# Patient Record
Sex: Female | Born: 1970 | Race: White | Hispanic: No | Smoking: Never smoker
Health system: Southern US, Community
[De-identification: ages and names within clinical notes are randomized; demographics above are authoritative.]

---

## 2002-06-22 ENCOUNTER — Ambulatory Visit (HOSPITAL_COMMUNITY): Admission: RE | Admit: 2002-06-22 | Discharge: 2002-06-22 | Payer: Self-pay | Admitting: *Deleted

## 2002-06-22 ENCOUNTER — Encounter: Payer: Self-pay | Admitting: *Deleted

## 2002-06-23 ENCOUNTER — Encounter: Payer: Self-pay | Admitting: *Deleted

## 2002-06-23 ENCOUNTER — Ambulatory Visit (HOSPITAL_COMMUNITY): Admission: RE | Admit: 2002-06-23 | Discharge: 2002-06-24 | Payer: Self-pay | Admitting: *Deleted

## 2002-09-10 ENCOUNTER — Encounter: Payer: Self-pay | Admitting: *Deleted

## 2002-09-10 ENCOUNTER — Ambulatory Visit (HOSPITAL_COMMUNITY): Admission: RE | Admit: 2002-09-10 | Discharge: 2002-09-10 | Payer: Self-pay | Admitting: *Deleted

## 2006-12-22 ENCOUNTER — Ambulatory Visit (HOSPITAL_COMMUNITY): Admission: RE | Admit: 2006-12-22 | Discharge: 2006-12-23 | Payer: Self-pay | Admitting: Neurosurgery

## 2007-05-04 ENCOUNTER — Ambulatory Visit: Payer: Self-pay | Admitting: Cardiology

## 2010-12-02 NOTE — Op Note (Signed)
Janice Ruiz, Janice Ruiz                ACCOUNT NO.:  [id number]   MEDICAL RECORD NO.:  [id number]          PATIENT TYPE:  AMB   LOCATION:  SDS                          FACILITY:  [facility]   PHYSICIAN:  Cristi Loron, M.D.DATE OF BIRTH:  Oct 18, 1970   DATE OF PROCEDURE:  12/22/2006  DATE OF DISCHARGE:                               OPERATIVE REPORT   BRIEF HISTORY:  The patient is 40 year old white female who has suffered  from back and left leg pain consistent with a left L4-T5 radiculopathy.  She failed medical management and was worked up with a lumbar MRI which  demonstrated herniated disk, L3-4 on the left.  I discussed various  treatment options with the patient including surgery.  The patient has  weighed the risks, benefits and alternatives to surgery and decided to  proceed with the left L3-4 microdiskectomy.   PREOPERATIVE DIAGNOSIS:  Left L3-4 herniated nucleus pulposus, spinal  stenosis, lumbar radiculopathy, lumbago.   POSTOPERATIVE DIAGNOSIS:  Left L3-4 herniated nucleus pulposus, spinal  stenosis, lumbar radiculopathy, lumbago.   PROCEDURE:  Left L3-4 microdiskectomy using microdissection.   SURGEON:  Dr. Delma Officer.   ASSISTANT:  None.   ANESTHESIA:  General endotracheal.   ESTIMATED BLOOD LOSS:  50 mL.   SPECIMENS:  None.   DRAINS:  None.   COMPLICATIONS:  None.   DESCRIPTION OF PROCEDURE:  The patient is brought to operating room by  anesthesia team.  General endotracheal anesthesia induced.  The patient  was turned prone position on Wilson frame.  Lumbosacral region was then  prepared with Betadine scrub and Betadine solution.  Sterile drapes  applied.  I then injected the area to be incised with Marcaine with  epinephrine solution.  I used scalpel to make a linear midline incision  over the L3-4 interspace.  I used electrocautery perform a left-sided  subperiosteal dissection exposing left spinous process lamina of L3 and  L4.  We obtained  intraoperative radiograph to confirm our location.   I then inserted a McCullough retractor for exposure and then brought the  operative microscope into the field.  Under its magnification and  illumination, completed the microdissection and decompression.  I used  high-speed drill perform a left L3 laminotomy.  I then widened the  laminotomies using Kerrison punch and removed left L3-4 ligamentum  flavum.  I also used the drill and Kerrison punch removing the cephalad  aspect of the left L4 lamina to perform a foraminotomy about the left L4  nerve root.  I then used microsection to free up the thecal sac from the  epidural tissue.  I then gently retracted the neural structures medially  with the D'Errico retractor.  This  exposed a disk herniation which had  migrated caudally from the L3-4 intervertebral disk space.  I removed  this disk fragment in multiple fragments using pituitary forceps.  I  then inspected intervertebral disk at L3-4.  It was bulging quite a bit  causing some ventral compression.  I therefore incised L3-4  intervertebral disk on the left and performed a partial intervertebral  diskectomy using pituitary  forceps and the Epstein curettes.  After I  was satisfied with intervertebral diskectomy, I used the osteophyte tool  to remove some redundant posterior longitudinal ligament from the  vertebral endplate at L3-4 further decompressing the neural structures.  We then obtained hemostasis using bipolar cautery.  We palpated along  the ventral surface of the thecal sac and along on the exit route of the  left L4 nerve root, noted neural structures well decompressed.  We  obtained hemostasis using bipolar electrocautery.  Then removed the  retractor and then reapproximated the patient's thoracolumbar fascia  with interrupted #1 Vicryl suture.  The subcutaneous tissue interrupted  2-0 Vicryl suture and the skin with Steri-Strips and Benzoin.  The wound  was then coated with  bacitracin ointment, sterile dressing applied.  The  drapes were removed.  The patient was subsequently returned to supine  position where she was extubated by anesthesia team and transported to  post anesthesia care unit in stable condition.  All sponge, instrument  and needle counts correct at end this case.      Cristi Loron, M.D.  Electronically Signed     JDJ/MEDQ  D:  12/22/2006  T:  12/22/2006  Job:  [id number]

## 2010-12-05 NOTE — Op Note (Signed)
NAMERAISA, DITTO                            ACCOUNT NO.:  [id number]   MEDICAL RECORD NO.:  [id number]                   PATIENT TYPE:  OIB   LOCATION:  3012                                 FACILITY:  [facility]   PHYSICIAN:  Ricky D. Gasper Sells, M.D.             DATE OF BIRTH:  Feb 15, 1971   DATE OF PROCEDURE:  06/23/2002  DATE OF DISCHARGE:                                 OPERATIVE REPORT   PREOPERATIVE DIAGNOSES:  1. Herniated nucleus pulposus center lying to the left, lumbar 4-5.  2. Herniated nucleus pulposus center lying to the right, lumbar 5, sacral 1.   POSTOPERATIVE DIAGNOSIS:   OPERATION PERFORMED:  1. Left lumbar 4-5 radical discoidectomy, microsurgical.  2. Right lumbar 5, sacral 1 radical discoidectomy, microsurgical.   SURGEON:  Ricky D. Gasper Sells, M.D.   ASSISTANT:  Alvis Lemmings, M.D.   ANESTHESIA:  General, controlled.   COUNTS:  Instrument count, sponge count and needle count correct.   COMPLICATIONS:  Nil.   ESTIMATED BLOOD LOSS:  Less than 50 cc.  No CSF leak.  No particular trauma  to nerve roots.   SUMMARY:  This is a 40 year old female with a four-year history of  intermittent low-back pain, which has been constant for the last month.  I  saw her in my office a few days before surgery.  She clearly had  dorsiflexion weakness on the left, and numbness and tingling at the top of  the foot of an intermittent nature, and plantar flexion weakness on the  right with positive straight leg raises and crossed straight leg raises  bilaterally.  Brought an MRI from mid November with her, which showed an HNP  eccentric to the left at L4-5 and near the midline, and smaller HNP more  midline at L5, S1.  I was going to do a myelogram on her before surgery, but  she continued to take her Prozac so that was stopped.  I felt her clinical  findings were strong enough plus the MRI that a week ago I had not operated  on her.  This was carried out on June 23, 2002.   At L4-5 on the left a huge free fragment was found subligamentously  measuring approximately 3.5 cm long by 5 mm by a centimeter wide.  This  extended from the left side over to the right.  For this reason it was not  felt necessary to decompress the right side at L4-5.  A L5, S1 a smaller  midline HNP was found and it was removed.  Both the left L5 and S1 nerve  roots were nicely decompressed.  The patient awoke from surgery moving all  four extremities, especially her dorsiflexion weakness was better as was the  plantar flexion weakness on the left and right respectively.   DESCRIPTION OF OPERATION:  With the patient in the prone position, all  pressure points inspected and padded  especially the common peroneal nerves  bilaterally and with bear hugger blanket in place and PDS hose, a midline  incision was made over the __________ L4-5 level and the spinous processes  were identified.  Starting out on the left paraspinous muscles and ligaments  were dissected free of the spinous processes and laminae of L4, L5 and S1.  Intraoperative control x-rays confirmed the appropriate levels were being  done, that is L4-5 and L5, S1.  Following that the right side was exposed  and self-retaining retractors were inserted.  Starting out on the left side  minimal inferior L4 laminotomy was performed as well as a medial facetectomy  at L4-5, and the underlying ligamentum flavum was removed with a 15 blade  knife reaching the epidural space and pushing it away with a patty.  Kerrison punch was then used to remove the ligamentum flavum in a stepwise  fashion; revealed the underlying ruptured L4-5 disk.  Bipolar was then used  to coagulate epidural veins and the L5 nerve was retracted medially to  reveal the underlying disk.  It was incised in a trapezoidal fashion by a 15  blade knife and immediately a large fragment started to express itself from  the interspace.  This fragment was grasped with  a pituitary instrument and  removed en bloc measuring approximately 3.5 cm x a centimeter x 5 mm.  Nevertheless, the disk was evacuated with straight-biting, upbiting and down  biting pituitary rongeurs, curetted with an Epstein instrument in the  subligamentous fascia.  Then the endplates were curetted especially the  inferior limbus with a medium bent ring curet.  The epidural space was then  palpated with the hockey stick instrument and epidural hemostasis was  achieved with Gelfoam.  Following that, once it was found to be clear a  hundred micrograms of fentanyl was left on the Gelfoam for three minutes and  then irrigated free.  The exact same procedure was carried out on the right  at L5, S1.  No free fragment was found, but a moderate midline ruptured disk  was found.  No problems were encountered at that level.   The wound was then irrigated with Betadine solution and hydrogen peroxide.  The self-retaining retractor was removed.  The dorsal lumbar fascia was  closed with 2-0 Vicryl in an interrupted fashion and the subfascial space  was inflated with a total of 5 cc of vancomycin solution from the  intravenous injection at the beginning of the case.  Subcutaneous closure  was carried pit with 2-0 Vicryl inverted interrupted and skin staples were  used in the skin.  A bacitracin ointment dry dressing was applied.   The patient tolerated the procedure well and awoke from surgery  unremarkably.                                                 Ricky D. Gasper Sells, M.D.    RDH/MEDQ  D:  06/23/2002  T:  06/24/2002  Job:  [id number]

## 2010-12-05 NOTE — Discharge Summary (Signed)
NAMEYAMNA, MACKEL                            ACCOUNT NO.:  [id number]   MEDICAL RECORD NO.:  [id number]                   PATIENT TYPE:  OIB   LOCATION:  3012                                 FACILITY:  [facility]   PHYSICIAN:  Ricky D. Gasper Sells, M.D.             DATE OF BIRTH:  1970/08/29   DATE OF ADMISSION:  06/23/2002  DATE OF DISCHARGE:  06/24/2002                                 DISCHARGE SUMMARY   ADMISSION DIAGNOSIS:  Herniated nucleus pulposis centerline eccentric to the  left, L4-5 and centerline perhaps to the right L5-S1 per MRI in 11/03, with  right S1 radiculopathy, mild, and left L5 radiculopathy, mixed motor and  sensory, moderate.   POSTOPERATIVE DIAGNOSIS:  Herniated nucleus pulposis centerline eccentric to  the left, L4-5 and centerline perhaps to the right L5-S1 per MRI in 11/03,  with right S1 radiculopathy, mild, and left L5 radiculopathy, mixed motor  and sensory, moderate.   PROCEDURE:  Left L4-5 microsurgical diskectomy and right L5-S1 microsurgical  diskectomy.   SURGEON:  Ricky D. Gasper Sells, M.D.   Threasa HeadsHope Pigeon.   ANESTHESIA:  General controlled.  Instrument count, sponge count, needle  count correct.   COMPLICATIONS:  Nil.   HISTORY OF PRESENT ILLNESS:  This is a 40 year old female that presented  with bilateral leg pain for the last month.  She has had similar episodes  for the last four years, each time lasting a month.  This time is more  severe, has not responded to medical treatment which is appropriate.  For  that reason, a MRI was done which showed a definite herniated nucleus  pulposis eccentric to the left midline at L4-5 and a smaller one at L5-S1  eccentric to the right.  On examination, she had history of a left L5  radiculopathy with numbness and tingling intermittent of the top of the foot  and definite dorsiflexion weakness, and some right-sided plantar flexion  weakness and an absent ankle jerk on the right with numbness in  the bottom  of the foot on the right side intermittent.  Based on these findings and the  resistance to conservative treatment and the repetitive episodes, it was  decided to go ahead and do a lumbar laminectomy on her ASAP.   HOSPITAL COURSE:  This was carried out on 06/23/02, it was completely  uncomplicated.  There was a huge fragment extending way over to the right  side past the midline, taking off from the left.  For that reason, I did not  do a bilateral exploration.  It was clear that we had gotten most of it from  the left side.  At L5-S1 the findings were less dramatic.  There was no huge  free fragment, but there was an eccentric bulging subligamentous disk.  The  operation itself was uncomplicated.  The patient tolerated the procedure  well.  In the evening postoperatively, I was  going to send her home, but she  was having trouble voiding as well as passing stool.  She began to void in  the morning following surgery after I put a Foley catheter in her overnight,  and gave her some Urecholine in the morning.  She was still constipated.  She was given a Fleet's enema, 20 mg of OxyContin and sent home.  Told to  change her dressing every second day.  She is given Lorcet 10 mg p.o. p.r.n.  for pain.  I will see her again in one weeks' time for followup in the  office.   CONDITION ON DISCHARGE:  Markedly improved.   DISPOSITION:  Home.   DISCHARGE DIAGNOSIS:  Status post L4-5 left and right L5-S1 lumbar  laminectomy.                                                Ricky D. Gasper Sells, M.D.    RDH/MEDQ  D:  06/24/2002  T:  06/25/2002  Job:  [id number]

## 2015-12-25 DIAGNOSIS — F410 Panic disorder [episodic paroxysmal anxiety] without agoraphobia: Secondary | ICD-10-CM | POA: Diagnosis not present

## 2015-12-25 DIAGNOSIS — K210 Gastro-esophageal reflux disease with esophagitis: Secondary | ICD-10-CM | POA: Diagnosis not present

## 2015-12-25 DIAGNOSIS — M1990 Unspecified osteoarthritis, unspecified site: Secondary | ICD-10-CM | POA: Diagnosis not present

## 2015-12-25 DIAGNOSIS — R5383 Other fatigue: Secondary | ICD-10-CM | POA: Diagnosis not present

## 2015-12-30 DIAGNOSIS — F331 Major depressive disorder, recurrent, moderate: Secondary | ICD-10-CM | POA: Diagnosis not present

## 2015-12-30 DIAGNOSIS — M545 Low back pain: Secondary | ICD-10-CM | POA: Diagnosis not present

## 2015-12-30 DIAGNOSIS — Z1389 Encounter for screening for other disorder: Secondary | ICD-10-CM | POA: Diagnosis not present

## 2015-12-30 DIAGNOSIS — J301 Allergic rhinitis due to pollen: Secondary | ICD-10-CM | POA: Diagnosis not present

## 2015-12-30 DIAGNOSIS — K219 Gastro-esophageal reflux disease without esophagitis: Secondary | ICD-10-CM | POA: Diagnosis not present

## 2016-04-07 DIAGNOSIS — K219 Gastro-esophageal reflux disease without esophagitis: Secondary | ICD-10-CM | POA: Diagnosis not present

## 2016-04-07 DIAGNOSIS — M545 Low back pain: Secondary | ICD-10-CM | POA: Diagnosis not present

## 2016-04-07 DIAGNOSIS — F331 Major depressive disorder, recurrent, moderate: Secondary | ICD-10-CM | POA: Diagnosis not present

## 2016-04-07 DIAGNOSIS — J301 Allergic rhinitis due to pollen: Secondary | ICD-10-CM | POA: Diagnosis not present

## 2016-06-30 DIAGNOSIS — K210 Gastro-esophageal reflux disease with esophagitis: Secondary | ICD-10-CM | POA: Diagnosis not present

## 2016-06-30 DIAGNOSIS — R5383 Other fatigue: Secondary | ICD-10-CM | POA: Diagnosis not present

## 2016-06-30 DIAGNOSIS — F331 Major depressive disorder, recurrent, moderate: Secondary | ICD-10-CM | POA: Diagnosis not present

## 2016-06-30 DIAGNOSIS — M1990 Unspecified osteoarthritis, unspecified site: Secondary | ICD-10-CM | POA: Diagnosis not present

## 2016-07-03 DIAGNOSIS — J301 Allergic rhinitis due to pollen: Secondary | ICD-10-CM | POA: Diagnosis not present

## 2016-07-03 DIAGNOSIS — F331 Major depressive disorder, recurrent, moderate: Secondary | ICD-10-CM | POA: Diagnosis not present

## 2016-07-03 DIAGNOSIS — M545 Low back pain: Secondary | ICD-10-CM | POA: Diagnosis not present

## 2016-07-03 DIAGNOSIS — K219 Gastro-esophageal reflux disease without esophagitis: Secondary | ICD-10-CM | POA: Diagnosis not present

## 2016-10-12 DIAGNOSIS — F331 Major depressive disorder, recurrent, moderate: Secondary | ICD-10-CM | POA: Diagnosis not present

## 2016-10-12 DIAGNOSIS — M545 Low back pain: Secondary | ICD-10-CM | POA: Diagnosis not present

## 2016-10-12 DIAGNOSIS — J301 Allergic rhinitis due to pollen: Secondary | ICD-10-CM | POA: Diagnosis not present

## 2016-10-12 DIAGNOSIS — K219 Gastro-esophageal reflux disease without esophagitis: Secondary | ICD-10-CM | POA: Diagnosis not present

## 2016-10-19 DIAGNOSIS — N83201 Unspecified ovarian cyst, right side: Secondary | ICD-10-CM | POA: Diagnosis not present

## 2016-10-19 DIAGNOSIS — N83202 Unspecified ovarian cyst, left side: Secondary | ICD-10-CM | POA: Diagnosis not present

## 2016-10-19 DIAGNOSIS — Z1231 Encounter for screening mammogram for malignant neoplasm of breast: Secondary | ICD-10-CM | POA: Diagnosis not present

## 2016-10-19 DIAGNOSIS — N939 Abnormal uterine and vaginal bleeding, unspecified: Secondary | ICD-10-CM | POA: Diagnosis not present

## 2016-11-06 DIAGNOSIS — H8113 Benign paroxysmal vertigo, bilateral: Secondary | ICD-10-CM | POA: Diagnosis not present

## 2016-11-06 DIAGNOSIS — J019 Acute sinusitis, unspecified: Secondary | ICD-10-CM | POA: Diagnosis not present

## 2017-01-12 DIAGNOSIS — Z1389 Encounter for screening for other disorder: Secondary | ICD-10-CM | POA: Diagnosis not present

## 2017-01-12 DIAGNOSIS — J301 Allergic rhinitis due to pollen: Secondary | ICD-10-CM | POA: Diagnosis not present

## 2017-01-12 DIAGNOSIS — K219 Gastro-esophageal reflux disease without esophagitis: Secondary | ICD-10-CM | POA: Diagnosis not present

## 2017-01-12 DIAGNOSIS — M545 Low back pain: Secondary | ICD-10-CM | POA: Diagnosis not present

## 2017-01-12 DIAGNOSIS — F331 Major depressive disorder, recurrent, moderate: Secondary | ICD-10-CM | POA: Diagnosis not present

## 2017-03-12 DIAGNOSIS — J301 Allergic rhinitis due to pollen: Secondary | ICD-10-CM | POA: Diagnosis not present

## 2017-03-12 DIAGNOSIS — K219 Gastro-esophageal reflux disease without esophagitis: Secondary | ICD-10-CM | POA: Diagnosis not present

## 2017-03-12 DIAGNOSIS — F331 Major depressive disorder, recurrent, moderate: Secondary | ICD-10-CM | POA: Diagnosis not present

## 2017-03-12 DIAGNOSIS — M545 Low back pain: Secondary | ICD-10-CM | POA: Diagnosis not present

## 2017-06-14 DIAGNOSIS — J301 Allergic rhinitis due to pollen: Secondary | ICD-10-CM | POA: Diagnosis not present

## 2017-06-14 DIAGNOSIS — Z0001 Encounter for general adult medical examination with abnormal findings: Secondary | ICD-10-CM | POA: Diagnosis not present

## 2017-06-14 DIAGNOSIS — M545 Low back pain: Secondary | ICD-10-CM | POA: Diagnosis not present

## 2017-06-14 DIAGNOSIS — K219 Gastro-esophageal reflux disease without esophagitis: Secondary | ICD-10-CM | POA: Diagnosis not present

## 2017-06-14 DIAGNOSIS — F331 Major depressive disorder, recurrent, moderate: Secondary | ICD-10-CM | POA: Diagnosis not present

## 2017-09-10 DIAGNOSIS — Z9189 Other specified personal risk factors, not elsewhere classified: Secondary | ICD-10-CM | POA: Diagnosis not present

## 2017-09-10 DIAGNOSIS — F331 Major depressive disorder, recurrent, moderate: Secondary | ICD-10-CM | POA: Diagnosis not present

## 2017-09-10 DIAGNOSIS — M1990 Unspecified osteoarthritis, unspecified site: Secondary | ICD-10-CM | POA: Diagnosis not present

## 2017-09-10 DIAGNOSIS — K210 Gastro-esophageal reflux disease with esophagitis: Secondary | ICD-10-CM | POA: Diagnosis not present

## 2017-09-16 DIAGNOSIS — M545 Low back pain: Secondary | ICD-10-CM | POA: Diagnosis not present

## 2017-09-16 DIAGNOSIS — R0602 Shortness of breath: Secondary | ICD-10-CM | POA: Diagnosis not present

## 2017-09-16 DIAGNOSIS — R002 Palpitations: Secondary | ICD-10-CM | POA: Diagnosis not present

## 2017-09-16 DIAGNOSIS — F410 Panic disorder [episodic paroxysmal anxiety] without agoraphobia: Secondary | ICD-10-CM | POA: Diagnosis not present

## 2017-09-16 DIAGNOSIS — K219 Gastro-esophageal reflux disease without esophagitis: Secondary | ICD-10-CM | POA: Diagnosis not present

## 2017-09-16 DIAGNOSIS — Z79891 Long term (current) use of opiate analgesic: Secondary | ICD-10-CM | POA: Diagnosis not present

## 2017-09-16 DIAGNOSIS — F331 Major depressive disorder, recurrent, moderate: Secondary | ICD-10-CM | POA: Diagnosis not present

## 2017-12-01 DIAGNOSIS — M545 Low back pain: Secondary | ICD-10-CM | POA: Diagnosis not present

## 2017-12-01 DIAGNOSIS — M48061 Spinal stenosis, lumbar region without neurogenic claudication: Secondary | ICD-10-CM | POA: Diagnosis not present

## 2017-12-01 DIAGNOSIS — M47816 Spondylosis without myelopathy or radiculopathy, lumbar region: Secondary | ICD-10-CM | POA: Diagnosis not present

## 2017-12-01 DIAGNOSIS — M5440 Lumbago with sciatica, unspecified side: Secondary | ICD-10-CM | POA: Diagnosis not present

## 2017-12-01 DIAGNOSIS — M4807 Spinal stenosis, lumbosacral region: Secondary | ICD-10-CM | POA: Diagnosis not present

## 2017-12-01 DIAGNOSIS — Z9889 Other specified postprocedural states: Secondary | ICD-10-CM | POA: Diagnosis not present

## 2017-12-08 ENCOUNTER — Other Ambulatory Visit: Payer: Self-pay | Admitting: Family Medicine

## 2017-12-08 DIAGNOSIS — G8929 Other chronic pain: Secondary | ICD-10-CM

## 2017-12-08 DIAGNOSIS — M545 Low back pain: Principal | ICD-10-CM

## 2017-12-16 DIAGNOSIS — K219 Gastro-esophageal reflux disease without esophagitis: Secondary | ICD-10-CM | POA: Diagnosis not present

## 2017-12-16 DIAGNOSIS — Z1389 Encounter for screening for other disorder: Secondary | ICD-10-CM | POA: Diagnosis not present

## 2017-12-16 DIAGNOSIS — M545 Low back pain: Secondary | ICD-10-CM | POA: Diagnosis not present

## 2017-12-16 DIAGNOSIS — Z79891 Long term (current) use of opiate analgesic: Secondary | ICD-10-CM | POA: Diagnosis not present

## 2017-12-16 DIAGNOSIS — J301 Allergic rhinitis due to pollen: Secondary | ICD-10-CM | POA: Diagnosis not present

## 2018-03-10 DIAGNOSIS — Z6832 Body mass index (BMI) 32.0-32.9, adult: Secondary | ICD-10-CM | POA: Diagnosis not present

## 2018-03-10 DIAGNOSIS — R1011 Right upper quadrant pain: Secondary | ICD-10-CM | POA: Diagnosis not present

## 2018-03-15 DIAGNOSIS — F410 Panic disorder [episodic paroxysmal anxiety] without agoraphobia: Secondary | ICD-10-CM | POA: Diagnosis not present

## 2018-03-15 DIAGNOSIS — Z79891 Long term (current) use of opiate analgesic: Secondary | ICD-10-CM | POA: Diagnosis not present

## 2018-03-15 DIAGNOSIS — M5136 Other intervertebral disc degeneration, lumbar region: Secondary | ICD-10-CM | POA: Diagnosis not present

## 2018-03-15 DIAGNOSIS — Z1389 Encounter for screening for other disorder: Secondary | ICD-10-CM | POA: Diagnosis not present

## 2018-03-15 DIAGNOSIS — M545 Low back pain: Secondary | ICD-10-CM | POA: Diagnosis not present

## 2018-03-15 DIAGNOSIS — K219 Gastro-esophageal reflux disease without esophagitis: Secondary | ICD-10-CM | POA: Diagnosis not present

## 2018-03-22 DIAGNOSIS — R109 Unspecified abdominal pain: Secondary | ICD-10-CM | POA: Diagnosis not present

## 2018-03-22 DIAGNOSIS — K5900 Constipation, unspecified: Secondary | ICD-10-CM | POA: Diagnosis not present

## 2018-03-22 DIAGNOSIS — Z9049 Acquired absence of other specified parts of digestive tract: Secondary | ICD-10-CM | POA: Diagnosis not present

## 2018-03-22 DIAGNOSIS — R1011 Right upper quadrant pain: Secondary | ICD-10-CM | POA: Diagnosis not present

## 2018-03-22 DIAGNOSIS — K439 Ventral hernia without obstruction or gangrene: Secondary | ICD-10-CM | POA: Diagnosis not present

## 2018-03-31 DIAGNOSIS — K439 Ventral hernia without obstruction or gangrene: Secondary | ICD-10-CM | POA: Diagnosis not present

## 2018-05-23 DIAGNOSIS — Z1231 Encounter for screening mammogram for malignant neoplasm of breast: Secondary | ICD-10-CM | POA: Diagnosis not present

## 2018-06-13 DIAGNOSIS — Z1389 Encounter for screening for other disorder: Secondary | ICD-10-CM | POA: Diagnosis not present

## 2018-06-13 DIAGNOSIS — M545 Low back pain: Secondary | ICD-10-CM | POA: Diagnosis not present

## 2018-06-13 DIAGNOSIS — Z1331 Encounter for screening for depression: Secondary | ICD-10-CM | POA: Diagnosis not present

## 2018-06-13 DIAGNOSIS — Z79891 Long term (current) use of opiate analgesic: Secondary | ICD-10-CM | POA: Diagnosis not present

## 2018-06-13 DIAGNOSIS — F410 Panic disorder [episodic paroxysmal anxiety] without agoraphobia: Secondary | ICD-10-CM | POA: Diagnosis not present

## 2018-06-13 DIAGNOSIS — G252 Other specified forms of tremor: Secondary | ICD-10-CM | POA: Diagnosis not present

## 2018-06-13 DIAGNOSIS — F331 Major depressive disorder, recurrent, moderate: Secondary | ICD-10-CM | POA: Diagnosis not present

## 2018-06-13 DIAGNOSIS — R002 Palpitations: Secondary | ICD-10-CM | POA: Diagnosis not present

## 2018-06-13 DIAGNOSIS — M5136 Other intervertebral disc degeneration, lumbar region: Secondary | ICD-10-CM | POA: Diagnosis not present

## 2018-07-26 DIAGNOSIS — G252 Other specified forms of tremor: Secondary | ICD-10-CM | POA: Diagnosis not present

## 2018-07-26 DIAGNOSIS — Z79891 Long term (current) use of opiate analgesic: Secondary | ICD-10-CM | POA: Diagnosis not present

## 2018-07-26 DIAGNOSIS — F410 Panic disorder [episodic paroxysmal anxiety] without agoraphobia: Secondary | ICD-10-CM | POA: Diagnosis not present

## 2018-07-26 DIAGNOSIS — K219 Gastro-esophageal reflux disease without esophagitis: Secondary | ICD-10-CM | POA: Diagnosis not present

## 2018-07-26 DIAGNOSIS — F331 Major depressive disorder, recurrent, moderate: Secondary | ICD-10-CM | POA: Diagnosis not present

## 2018-09-01 DIAGNOSIS — R509 Fever, unspecified: Secondary | ICD-10-CM | POA: Diagnosis not present

## 2018-09-01 DIAGNOSIS — M5136 Other intervertebral disc degeneration, lumbar region: Secondary | ICD-10-CM | POA: Diagnosis not present

## 2018-09-01 DIAGNOSIS — K219 Gastro-esophageal reflux disease without esophagitis: Secondary | ICD-10-CM | POA: Diagnosis not present

## 2018-09-01 DIAGNOSIS — J101 Influenza due to other identified influenza virus with other respiratory manifestations: Secondary | ICD-10-CM | POA: Diagnosis not present

## 2018-09-01 DIAGNOSIS — Z1389 Encounter for screening for other disorder: Secondary | ICD-10-CM | POA: Diagnosis not present

## 2018-09-01 DIAGNOSIS — Z79891 Long term (current) use of opiate analgesic: Secondary | ICD-10-CM | POA: Diagnosis not present

## 2018-09-01 DIAGNOSIS — R002 Palpitations: Secondary | ICD-10-CM | POA: Diagnosis not present

## 2018-09-13 DIAGNOSIS — Z23 Encounter for immunization: Secondary | ICD-10-CM | POA: Diagnosis not present

## 2018-09-29 DIAGNOSIS — Z79891 Long term (current) use of opiate analgesic: Secondary | ICD-10-CM | POA: Diagnosis not present

## 2018-09-29 DIAGNOSIS — F410 Panic disorder [episodic paroxysmal anxiety] without agoraphobia: Secondary | ICD-10-CM | POA: Diagnosis not present

## 2018-09-29 DIAGNOSIS — F331 Major depressive disorder, recurrent, moderate: Secondary | ICD-10-CM | POA: Diagnosis not present

## 2018-09-29 DIAGNOSIS — Z1389 Encounter for screening for other disorder: Secondary | ICD-10-CM | POA: Diagnosis not present

## 2018-09-29 DIAGNOSIS — K219 Gastro-esophageal reflux disease without esophagitis: Secondary | ICD-10-CM | POA: Diagnosis not present

## 2018-12-19 DIAGNOSIS — M545 Low back pain: Secondary | ICD-10-CM | POA: Diagnosis not present

## 2018-12-19 DIAGNOSIS — K219 Gastro-esophageal reflux disease without esophagitis: Secondary | ICD-10-CM | POA: Diagnosis not present

## 2018-12-19 DIAGNOSIS — Z0000 Encounter for general adult medical examination without abnormal findings: Secondary | ICD-10-CM | POA: Diagnosis not present

## 2018-12-19 DIAGNOSIS — F331 Major depressive disorder, recurrent, moderate: Secondary | ICD-10-CM | POA: Diagnosis not present

## 2018-12-19 DIAGNOSIS — F410 Panic disorder [episodic paroxysmal anxiety] without agoraphobia: Secondary | ICD-10-CM | POA: Diagnosis not present

## 2018-12-19 DIAGNOSIS — G252 Other specified forms of tremor: Secondary | ICD-10-CM | POA: Diagnosis not present

## 2018-12-19 DIAGNOSIS — Z79891 Long term (current) use of opiate analgesic: Secondary | ICD-10-CM | POA: Diagnosis not present

## 2019-03-22 DIAGNOSIS — M545 Low back pain: Secondary | ICD-10-CM | POA: Diagnosis not present

## 2019-03-22 DIAGNOSIS — G252 Other specified forms of tremor: Secondary | ICD-10-CM | POA: Diagnosis not present

## 2019-03-22 DIAGNOSIS — F331 Major depressive disorder, recurrent, moderate: Secondary | ICD-10-CM | POA: Diagnosis not present

## 2019-03-22 DIAGNOSIS — F410 Panic disorder [episodic paroxysmal anxiety] without agoraphobia: Secondary | ICD-10-CM | POA: Diagnosis not present

## 2019-03-22 DIAGNOSIS — K219 Gastro-esophageal reflux disease without esophagitis: Secondary | ICD-10-CM | POA: Diagnosis not present

## 2019-07-02 DIAGNOSIS — Z20828 Contact with and (suspected) exposure to other viral communicable diseases: Secondary | ICD-10-CM | POA: Diagnosis not present

## 2019-07-02 DIAGNOSIS — U071 COVID-19: Secondary | ICD-10-CM | POA: Diagnosis not present

## 2019-07-02 DIAGNOSIS — R05 Cough: Secondary | ICD-10-CM | POA: Diagnosis not present

## 2019-07-04 DIAGNOSIS — F410 Panic disorder [episodic paroxysmal anxiety] without agoraphobia: Secondary | ICD-10-CM | POA: Diagnosis not present

## 2019-07-04 DIAGNOSIS — F331 Major depressive disorder, recurrent, moderate: Secondary | ICD-10-CM | POA: Diagnosis not present

## 2019-07-04 DIAGNOSIS — K219 Gastro-esophageal reflux disease without esophagitis: Secondary | ICD-10-CM | POA: Diagnosis not present

## 2019-07-04 DIAGNOSIS — Z79891 Long term (current) use of opiate analgesic: Secondary | ICD-10-CM | POA: Diagnosis not present

## 2019-07-04 DIAGNOSIS — G252 Other specified forms of tremor: Secondary | ICD-10-CM | POA: Diagnosis not present

## 2019-10-04 DIAGNOSIS — J301 Allergic rhinitis due to pollen: Secondary | ICD-10-CM | POA: Diagnosis not present

## 2019-10-04 DIAGNOSIS — K219 Gastro-esophageal reflux disease without esophagitis: Secondary | ICD-10-CM | POA: Diagnosis not present

## 2019-10-04 DIAGNOSIS — R002 Palpitations: Secondary | ICD-10-CM | POA: Diagnosis not present

## 2019-10-04 DIAGNOSIS — Z1389 Encounter for screening for other disorder: Secondary | ICD-10-CM | POA: Diagnosis not present

## 2019-10-04 DIAGNOSIS — Z5181 Encounter for therapeutic drug level monitoring: Secondary | ICD-10-CM | POA: Diagnosis not present

## 2019-10-04 DIAGNOSIS — M545 Low back pain: Secondary | ICD-10-CM | POA: Diagnosis not present

## 2019-10-04 DIAGNOSIS — Z79891 Long term (current) use of opiate analgesic: Secondary | ICD-10-CM | POA: Diagnosis not present

## 2019-10-04 DIAGNOSIS — F410 Panic disorder [episodic paroxysmal anxiety] without agoraphobia: Secondary | ICD-10-CM | POA: Diagnosis not present

## 2019-10-04 DIAGNOSIS — R4582 Worries: Secondary | ICD-10-CM | POA: Diagnosis not present

## 2019-10-04 DIAGNOSIS — F331 Major depressive disorder, recurrent, moderate: Secondary | ICD-10-CM | POA: Diagnosis not present

## 2019-11-20 DIAGNOSIS — F331 Major depressive disorder, recurrent, moderate: Secondary | ICD-10-CM | POA: Diagnosis not present

## 2019-11-20 DIAGNOSIS — Z6835 Body mass index (BMI) 35.0-35.9, adult: Secondary | ICD-10-CM | POA: Diagnosis not present

## 2019-11-20 DIAGNOSIS — G43909 Migraine, unspecified, not intractable, without status migrainosus: Secondary | ICD-10-CM | POA: Diagnosis not present

## 2019-11-20 DIAGNOSIS — R232 Flushing: Secondary | ICD-10-CM | POA: Diagnosis not present

## 2019-11-21 DIAGNOSIS — R519 Headache, unspecified: Secondary | ICD-10-CM | POA: Diagnosis not present

## 2019-12-12 DIAGNOSIS — J029 Acute pharyngitis, unspecified: Secondary | ICD-10-CM | POA: Diagnosis not present

## 2019-12-12 DIAGNOSIS — J209 Acute bronchitis, unspecified: Secondary | ICD-10-CM | POA: Diagnosis not present

## 2019-12-12 DIAGNOSIS — Z20828 Contact with and (suspected) exposure to other viral communicable diseases: Secondary | ICD-10-CM | POA: Diagnosis not present

## 2019-12-12 DIAGNOSIS — J069 Acute upper respiratory infection, unspecified: Secondary | ICD-10-CM | POA: Diagnosis not present

## 2019-12-30 DIAGNOSIS — J019 Acute sinusitis, unspecified: Secondary | ICD-10-CM | POA: Diagnosis not present

## 2020-01-11 DIAGNOSIS — F410 Panic disorder [episodic paroxysmal anxiety] without agoraphobia: Secondary | ICD-10-CM | POA: Diagnosis not present

## 2020-01-11 DIAGNOSIS — Z79891 Long term (current) use of opiate analgesic: Secondary | ICD-10-CM | POA: Diagnosis not present

## 2020-01-11 DIAGNOSIS — G43909 Migraine, unspecified, not intractable, without status migrainosus: Secondary | ICD-10-CM | POA: Diagnosis not present

## 2020-01-11 DIAGNOSIS — R002 Palpitations: Secondary | ICD-10-CM | POA: Diagnosis not present

## 2020-01-11 DIAGNOSIS — R4582 Worries: Secondary | ICD-10-CM | POA: Diagnosis not present

## 2020-01-11 DIAGNOSIS — Z0001 Encounter for general adult medical examination with abnormal findings: Secondary | ICD-10-CM | POA: Diagnosis not present

## 2020-02-10 DIAGNOSIS — J040 Acute laryngitis: Secondary | ICD-10-CM | POA: Diagnosis not present

## 2020-02-10 DIAGNOSIS — R05 Cough: Secondary | ICD-10-CM | POA: Diagnosis not present

## 2020-02-10 DIAGNOSIS — R1110 Vomiting, unspecified: Secondary | ICD-10-CM | POA: Diagnosis not present

## 2020-02-12 DIAGNOSIS — J069 Acute upper respiratory infection, unspecified: Secondary | ICD-10-CM | POA: Diagnosis not present

## 2020-02-12 DIAGNOSIS — J0101 Acute recurrent maxillary sinusitis: Secondary | ICD-10-CM | POA: Diagnosis not present

## 2020-02-12 DIAGNOSIS — Z20828 Contact with and (suspected) exposure to other viral communicable diseases: Secondary | ICD-10-CM | POA: Diagnosis not present

## 2020-02-12 DIAGNOSIS — J040 Acute laryngitis: Secondary | ICD-10-CM | POA: Diagnosis not present

## 2020-04-02 DIAGNOSIS — Z1231 Encounter for screening mammogram for malignant neoplasm of breast: Secondary | ICD-10-CM | POA: Diagnosis not present

## 2020-04-17 DIAGNOSIS — F410 Panic disorder [episodic paroxysmal anxiety] without agoraphobia: Secondary | ICD-10-CM | POA: Diagnosis not present

## 2020-04-17 DIAGNOSIS — R4582 Worries: Secondary | ICD-10-CM | POA: Diagnosis not present

## 2020-04-17 DIAGNOSIS — G43909 Migraine, unspecified, not intractable, without status migrainosus: Secondary | ICD-10-CM | POA: Diagnosis not present

## 2020-04-17 DIAGNOSIS — R232 Flushing: Secondary | ICD-10-CM | POA: Diagnosis not present

## 2020-04-17 DIAGNOSIS — F331 Major depressive disorder, recurrent, moderate: Secondary | ICD-10-CM | POA: Diagnosis not present

## 2020-04-17 DIAGNOSIS — K219 Gastro-esophageal reflux disease without esophagitis: Secondary | ICD-10-CM | POA: Diagnosis not present

## 2020-04-17 DIAGNOSIS — Z7189 Other specified counseling: Secondary | ICD-10-CM | POA: Diagnosis not present

## 2020-04-24 DIAGNOSIS — R928 Other abnormal and inconclusive findings on diagnostic imaging of breast: Secondary | ICD-10-CM | POA: Diagnosis not present

## 2020-07-04 DIAGNOSIS — Z6834 Body mass index (BMI) 34.0-34.9, adult: Secondary | ICD-10-CM | POA: Diagnosis not present

## 2020-07-04 DIAGNOSIS — R079 Chest pain, unspecified: Secondary | ICD-10-CM | POA: Diagnosis not present

## 2020-07-04 DIAGNOSIS — I10 Essential (primary) hypertension: Secondary | ICD-10-CM | POA: Diagnosis not present

## 2020-07-17 DIAGNOSIS — R079 Chest pain, unspecified: Secondary | ICD-10-CM | POA: Diagnosis not present

## 2020-07-17 DIAGNOSIS — R002 Palpitations: Secondary | ICD-10-CM | POA: Diagnosis not present

## 2021-06-16 ENCOUNTER — Encounter: Payer: Self-pay | Admitting: Neurology

## 2021-09-08 NOTE — Progress Notes (Deleted)
NEUROLOGY CONSULTATION NOTE  Janice Ruiz MRN: [id number] DOB: [birth year]  Referring provider: Gar Ponto, MD Primary care provider: Gar Ponto, MD  Reason for consult:  migraines  Assessment/Plan:   ***   Subjective:  Janice Ruiz is a 51 year old female with asthma, arthritis *** who presents for migraines.  History supplemented by ED, Urgent Care and referring provider's notes.  *** In October, she developed a new moderate persistent headache that progressively got worse.  A pressure-like pain in band-like distribution.  Associated with photophobia, phonophobia ***.  No associated visual disturbance, dizziness, autonomic symptoms, numbness or weakness.  No preceding illness or trauma but reported increased emotional stress and sleep difficulty.  She was seen in Urgent Care on 11/2 and later in the ED on 05/30/2021 where CT head was performed and was negative.  Did not respond to migraine cocktail or prednisone taper.   MRI of brain without contrast on 06/10/2021 was unremarkable.  Current NSAIDS/analgesics:  nabumetone (arthritis), oxycodone (***) Current triptans:  *** Current ergotamine:  *** Current anti-emetic:  *** Current muscle relaxants:  *** Current Antihypertensive medications:  Lasix 20mg  daily Current Antidepressant medications:  sertraline 200mg  daily Current Anticonvulsant medications:  *** Current anti-CGRP:  Emgality Current Vitamins/Herbal/Supplements:  *** Current Antihistamines/Decongestants:  hydroxyzine Other therapy:  *** Hormone/birth control:  *** Other medications:  ***  Past NSAIDS/analgesics:  *** Past abortive triptans:  *** Past abortive ergotamine:  *** Past muscle relaxants:  *** Past anti-emetic:  *** Past antihypertensive medications:  *** Past antidepressant medications:  *** Past anticonvulsant medications:  *** Past anti-CGRP:  *** Past vitamins/Herbal/Supplements:  *** Past antihistamines/decongestants:  *** Other past  therapies:  prednisone taper  Caffeine:  *** Alcohol:  *** Smoker:  *** Diet:  *** Exercise:  *** Depression:  ***; Anxiety:  *** Other pain:  *** Sleep hygiene:  *** Family history of headache:  ***      PAST MEDICAL HISTORY: No past medical history on file.  PAST SURGICAL HISTORY: *** The histories are not reviewed yet. Please review them in the "History" navigator section and refresh this [place].  MEDICATIONS: No current outpatient medications on file prior to visit.   No current facility-administered medications on file prior to visit.    ALLERGIES: Not on File  FAMILY HISTORY: No family history on file.  Objective:  *** General: No acute distress.  Patient appears well-groomed.   Head:  Normocephalic/atraumatic Eyes:  fundi examined but not visualized Neck: supple, no paraspinal tenderness, full range of motion Back: No paraspinal tenderness Heart: regular rate and rhythm Lungs: Clear to auscultation bilaterally. Vascular: No carotid bruits. Neurological Exam: Mental status: alert and oriented to person, place, and time, recent and remote memory intact, fund of knowledge intact, attention and concentration intact, speech fluent and not dysarthric, language intact. Cranial nerves: CN I: not tested CN II: pupils equal, round and reactive to light, visual fields intact CN III, IV, VI:  full range of motion, no nystagmus, no ptosis CN V: facial sensation intact. CN VII: upper and lower face symmetric CN VIII: hearing intact CN IX, X: gag intact, uvula midline CN XI: sternocleidomastoid and trapezius muscles intact CN XII: tongue midline Bulk & Tone: normal, no fasciculations. Motor:  muscle strength 5/5 throughout Sensation:  Pinprick, temperature and vibratory sensation intact. Deep Tendon Reflexes:  2+ throughout,  toes downgoing.   Finger to nose testing:  Without dysmetria.   Heel to shin:  Without dysmetria.   Gait:  Normal station and  stride.   Romberg negative.    Thank you for allowing me to take part in the care of this patient.  Metta Clines, DO  CC: ***

## 2021-09-09 ENCOUNTER — Ambulatory Visit: Payer: BC Managed Care – PPO | Admitting: Neurology

## 2021-09-09 ENCOUNTER — Encounter: Payer: Self-pay | Admitting: Neurology

## 2021-09-09 DIAGNOSIS — Z029 Encounter for administrative examinations, unspecified: Secondary | ICD-10-CM

## 2021-09-17 HISTORY — PX: ANKLE FRACTURE SURGERY: SHX122

## 2021-10-01 ENCOUNTER — Other Ambulatory Visit (HOSPITAL_COMMUNITY): Payer: Self-pay | Admitting: Orthopedic Surgery

## 2021-10-01 DIAGNOSIS — H04309 Unspecified dacryocystitis of unspecified lacrimal passage: Secondary | ICD-10-CM

## 2021-10-01 DIAGNOSIS — S82851A Displaced trimalleolar fracture of right lower leg, initial encounter for closed fracture: Secondary | ICD-10-CM

## 2021-10-02 ENCOUNTER — Other Ambulatory Visit: Payer: Self-pay

## 2021-10-02 ENCOUNTER — Ambulatory Visit (HOSPITAL_COMMUNITY)
Admission: RE | Admit: 2021-10-02 | Discharge: 2021-10-02 | Disposition: A | Discharge status: Home / Self Care | Payer: BC Managed Care – PPO | Source: Ambulatory Visit | Attending: Orthopedic Surgery | Admitting: Orthopedic Surgery

## 2021-10-02 DIAGNOSIS — S82851A Displaced trimalleolar fracture of right lower leg, initial encounter for closed fracture: Secondary | ICD-10-CM | POA: Diagnosis not present

## 2021-12-17 ENCOUNTER — Ambulatory Visit: Payer: BC Managed Care – PPO | Admitting: Neurology

## 2022-03-12 LAB — COLOGUARD: COLOGUARD: NEGATIVE

## 2022-05-12 ENCOUNTER — Ambulatory Visit: Payer: Self-pay | Admitting: Neurology

## 2022-09-11 NOTE — Progress Notes (Unsigned)
NEUROLOGY CONSULTATION NOTE  Janice Ruiz MRN: [id number] DOB: [birth year]  Referring provider: Gar Ponto, MD Primary care provider: Gar Ponto, MD  Reason for consult:  headaches  Assessment/Plan:   Chronic migraine without aura, without status migrainosus, not intractable Excessive daytime sleepiness/snoring/wakes up with headache - reports that she sleeps throughout the night - would evaluate for OSA  Refer to sleep medicine for evaluation of OSA Migraine prevention:  Start propranolol ER '60mg'$  daily.  We can increase to '80mg'$  daily in 4 weeks if needed Migraine rescue:  sumatriptan '100mg'$ .  Zofran '4mg'$  for nausea.  STOP BC AND IBUPROFEN Limit use of pain relievers to no more than 2 days out of week to prevent risk of rebound or medication-overuse headache. Keep headache diary Follow up 4-5 months.    Subjective:  Janice Ruiz is a 52 year old female with history of kidney stones who presents for headaches.  History supplemented by referring provider's note.  Headaches since young adulthood.  Worse over past 4-5 years.  Intensity:  Moderate-severe.  Location:  varies.  Sometimes back of head/neck, top of head, temples, periorbital/maxillary; Quality:  Pressure, throbbing.  Duration:  all day.  Wakes up with headache.  Frequency:  daily (4 days out of month is severe with associated symptoms).  In late 2023, she had a persistent headache for 45 days.  Aura:  absent; Prodrome:  absent.  Associated symptoms:  nausea, photophobia, phononphobia.  Denies vomiting, visual disturbance, unilateral numbness or weakness.  Triggers:  unknown.  Relieving factors:  none.  Takes BC powder and ibuprofen (takes daily)  MRI of brain without contrast on 06/10/2021 showed no intracranial mass or other significant abnormality.  Past NSAIDS/analgesics:  Excedrin, acetaminophen Past abortive triptans:  none Past abortive ergotamine:  none Past muscle relaxants:  none Past anti-emetic:   none Past antihypertensive medications:  furosemide Past antidepressant medications:  Cymbalta, Lexapro, Celexa, Wellbutrin, Prozac Past anticonvulsant medications:  none Past anti-CGRP:  Emgality Past vitamins/Herbal/Supplements:  none Past antihistamines/decongestants:  none Other past therapies:  none  Current NSAIDS/analgesics:  Ibuprofen, BC powder, Relafen, oxycodone Current triptans:  none Current ergotamine:  none Current anti-emetic:  none Current muscle relaxants:  cyclobenzaprine '10mg'$  TID (only takes at bedtime due to drowsiness) Current Antihypertensive medications:  none Current Antidepressant medications:  sertraline '200mg'$  daily Current Anticonvulsant medications:  none Current anti-CGRP:  none Current Vitamins/Herbal/Supplements:  none Current Antihistamines/Decongestants:  hydroxyzine Other therapy:  none   Caffeine:  1 cup of coffee daily during winter.  Dr. Malachi Bonds daily Diet:  32 oz water daily (drinking more makes ankle swell).  Skips meals.   Exercise:  just restarted gym Depression:  stable; Anxiety:  usually okay Other pain:  chronic back pain s/p 2 back surgeries Sleep hygiene:  good however tired all day.  Snores and sometimes wakes self off.   Family history of headache:  no      PAST MEDICAL HISTORY: History reviewed. No pertinent past medical history.  PAST SURGICAL HISTORY: Past Surgical History:  Procedure Laterality Date   ANKLE FRACTURE SURGERY Right 09/2021    MEDICATIONS: Current Outpatient Medications on File Prior to Visit  Medication Sig Dispense Refill   busPIRone (BUSPAR) 15 MG tablet Take 15 mg by mouth 2 (two) times daily as needed.     hydrOXYzine (ATARAX) 25 MG tablet Take 25 mg by mouth in the morning and at bedtime.     nabumetone (RELAFEN) 750 MG tablet Take 1,500 mg by mouth daily.  pantoprazole (PROTONIX) 40 MG tablet Take 40 mg by mouth daily.     sertraline (ZOLOFT) 100 MG tablet Take 200 mg by mouth daily.     No  current facility-administered medications on file prior to visit.     ALLERGIES: Allergies  Allergen Reactions   Benadryl [Diphenhydramine]    Lexapro [Escitalopram] Other (See Comments)    Excessive sleepiness, weight gain     FAMILY HISTORY: No family history on file.  Objective:  Blood pressure (!) 151/91, pulse 78, height 5' 5.5" (1.664 m), weight 204 lb (92.5 kg), SpO2 98 %. General: No acute distress.  Patient appears well-groomed.   Head:  Normocephalic/atraumatic Eyes:  fundi examined but not visualized Neck: supple, no paraspinal tenderness, full range of motion Back: No paraspinal tenderness Heart: regular rate and rhythm Lungs: Clear to auscultation bilaterally. Vascular: No carotid bruits. Neurological Exam: Mental status: alert and oriented to person, place, and time, speech fluent and not dysarthric, language intact. Cranial nerves: CN I: not tested CN II: pupils equal, round and reactive to light, visual fields intact CN III, IV, VI:  full range of motion, no nystagmus, no ptosis CN V: facial sensation intact. CN VII: upper and lower face symmetric CN VIII: hearing intact CN IX, X: gag intact, uvula midline CN XI: sternocleidomastoid and trapezius muscles intact CN XII: tongue midline Bulk & Tone: normal, no fasciculations. Motor:  muscle strength 5/5 throughout Sensation:  Pinprick, temperature and vibratory sensation intact. Deep Tendon Reflexes:  2+ throughout,  toes downgoing.   Finger to nose testing:  Without dysmetria.   Heel to shin:  Without dysmetria.   Gait:  Normal station and stride.  Romberg negative.    Thank you for allowing me to take part in the care of this patient.  Metta Clines, DO  CC: Gar Ponto, MD

## 2022-09-14 ENCOUNTER — Encounter: Payer: Self-pay | Admitting: Neurology

## 2022-09-14 ENCOUNTER — Ambulatory Visit: Payer: BC Managed Care – PPO | Admitting: Neurology

## 2022-09-14 VITALS — BP 151/91 | HR 78 | Ht 65.5 in | Wt 204.0 lb

## 2022-09-14 DIAGNOSIS — G4719 Other hypersomnia: Secondary | ICD-10-CM

## 2022-09-14 DIAGNOSIS — R0683 Snoring: Secondary | ICD-10-CM

## 2022-09-14 DIAGNOSIS — G43709 Chronic migraine without aura, not intractable, without status migrainosus: Secondary | ICD-10-CM | POA: Diagnosis not present

## 2022-09-14 MED ORDER — SUMATRIPTAN SUCCINATE 100 MG PO TABS: 100.0000 mg | ORAL_TABLET | ORAL | 5 refills | Status: DC | PRN

## 2022-09-14 MED ORDER — ONDANSETRON HCL 4 MG PO TABS: 4.0000 mg | ORAL_TABLET | Freq: Three times a day (TID) | ORAL | 5 refills | Status: AC | PRN

## 2022-09-14 MED ORDER — PROPRANOLOL HCL ER 60 MG PO CP24: 60.0000 mg | ORAL_CAPSULE | Freq: Every day | ORAL | 5 refills | Status: AC

## 2022-09-14 NOTE — Patient Instructions (Addendum)
  Refer to sleep medicine for evaluation of sleep apnea Start propranolol ER 108m daily.  Contact uKoreain 4 weeks with update and we can increase dose if needed. Take sumatriptan 1056mat earliest onset of headache.  May repeat dose once in 2 hours if needed.  Maximum 2 tablets in 24 hours. Take Zofran for nausea STOP BC AND IBUPROFEN.  ONLY TAKE SUMATRIPTAN FOR MIGRAINE ATTACKS.  Limit use of pain relievers to no more than 2 days out of the week.  These medications include acetaminophen, NSAIDs (ibuprofen/Advil/Motrin, naproxen/Aleve, triptans (Imitrex/sumatriptan), Excedrin, and narcotics.  This will help reduce risk of rebound headaches. Routine exercise Stay adequately hydrated (aim for 64 oz water daily) Keep headache diary Maintain proper stress management Maintain proper sleep hygiene Do not skip meals Consider supplements:  magnesium citrate 40065maily, riboflavin 400m48mily, coenzyme Q10 300mg1mly. Follow up 4 to 5 months

## 2022-09-30 ENCOUNTER — Institutional Professional Consult (permissible substitution): Payer: BC Managed Care – PPO | Admitting: Primary Care

## 2023-03-12 NOTE — Progress Notes (Deleted)
NEUROLOGY FOLLOW UP OFFICE NOTE  Janice Ruiz [id number]  Assessment/Plan:   Chronic migraine without aura, without status migrainosus, not intractable Excessive daytime sleepiness/snoring/wakes up with headache - reports that she sleeps throughout the night - would evaluate for OSA   Refer to sleep medicine for evaluation of OSA Migraine prevention:  Start propranolol ER 60mg  daily.  We can increase to 80mg  daily in 4 weeks if needed Migraine rescue:  sumatriptan 100mg .  Zofran 4mg  for nausea.  STOP BC AND IBUPROFEN Limit use of pain relievers to no more than 2 days out of week to prevent risk of rebound or medication-overuse headache. Keep headache diary Follow up 4-5 months.  Subjective:  Janice Ruiz is a 52 year old female with history of kidney stones who follows up for migraines.  UPDATE: Started propranolol. Intensity:  *** Duration:  *** with sumatriptan Frequency:  *** Frequency of abortive medication: *** Current NSAIDS/analgesics:   Relafen, oxycodone Current triptans:  sumatriptan 100mg  Current ergotamine:  none Current anti-emetic:  Zofran 4mg  Current muscle relaxants:  cyclobenzaprine 10mg  TID (only takes at bedtime due to drowsiness) Current Antihypertensive medications:  propranolol ER 60mg  daily *** Current Antidepressant medications:  sertraline 200mg  daily Current Anticonvulsant medications:  none Current anti-CGRP:  none Current Vitamins/Herbal/Supplements:  none Current Antihistamines/Decongestants:  hydroxyzine Other therapy:  none     Caffeine:  1 cup of coffee daily during winter.  Dr. Reino Kent daily Diet:  32 oz water daily (drinking more makes ankle swell).  Skips meals.   Exercise:  just restarted gym Depression:  stable; Anxiety:  usually okay Other pain:  chronic back pain s/p 2 back surgeries Sleep hygiene:  good however tired all day.  Snores and sometimes wakes self off.  Referred to sleep medicine ***  HISTORY:  Headaches since  young adulthood.  Worse over past 4-5 years.  Intensity:  Moderate-severe.  Location:  varies.  Sometimes back of head/neck, top of head, temples, periorbital/maxillary; Quality:  Pressure, throbbing.  Duration:  all day.  Wakes up with headache.  Frequency:  daily (4 days out of month is severe with associated symptoms).  In late 2023, she had a persistent headache for 45 days.  Aura:  absent; Prodrome:  absent.  Associated symptoms:  nausea, photophobia, phononphobia.  Denies vomiting, visual disturbance, unilateral numbness or weakness.  Triggers:  unknown.  Relieving factors:  none.  Takes BC powder and ibuprofen (takes daily)   MRI of brain without contrast on 06/10/2021 showed no intracranial mass or other significant abnormality.   Past NSAIDS/analgesics:  Excedrin, acetaminophen, Ibuprofen, BC powder, Past abortive triptans:  none Past abortive ergotamine:  none Past muscle relaxants:  none Past anti-emetic:  none Past antihypertensive medications:  furosemide Past antidepressant medications:  Cymbalta, Lexapro, Celexa, Wellbutrin, Prozac Past anticonvulsant medications:  none Past anti-CGRP:  Emgality Past vitamins/Herbal/Supplements:  none Past antihistamines/decongestants:  none Other past therapies:  none    Family history of headache:  no  PAST MEDICAL HISTORY: No past medical history on file.  MEDICATIONS: Current Outpatient Medications on File Prior to Visit  Medication Sig Dispense Refill   busPIRone (BUSPAR) 15 MG tablet Take 15 mg by mouth 2 (two) times daily as needed.     hydrOXYzine (ATARAX) 25 MG tablet Take 25 mg by mouth in the morning and at bedtime.     nabumetone (RELAFEN) 750 MG tablet Take 1,500 mg by mouth daily.     ondansetron (ZOFRAN) 4 MG tablet Take 1 tablet (4 mg total)  by mouth every 8 (eight) hours as needed for nausea or vomiting. 20 tablet 5   pantoprazole (PROTONIX) 40 MG tablet Take 40 mg by mouth daily.     propranolol ER (INDERAL LA) 60 MG 24  hr capsule Take 1 capsule (60 mg total) by mouth daily. 30 capsule 5   sertraline (ZOLOFT) 100 MG tablet Take 200 mg by mouth daily.     SUMAtriptan (IMITREX) 100 MG tablet Take 1 tablet (100 mg total) by mouth as needed for migraine. May repeat in 2 hours if headache persists or recurs.  Maximum 2 tablets in 24 hours. 10 tablet 5   No current facility-administered medications on file prior to visit.    ALLERGIES: Allergies  Allergen Reactions   Benadryl [Diphenhydramine]    Lexapro [Escitalopram] Other (See Comments)    Excessive sleepiness, weight gain    FAMILY HISTORY: No family history on file.    Objective:  *** General: No acute distress.  Patient appears ***-groomed.   Head:  Normocephalic/atraumatic Eyes:  Fundi examined but not visualized Neck: supple, no paraspinal tenderness, full range of motion Heart:  Regular rate and rhythm Lungs:  Clear to auscultation bilaterally Back: No paraspinal tenderness Neurological Exam: alert and oriented.  Speech fluent and not dysarthric, language intact.  CN II-XII intact. Bulk and tone normal, muscle strength 5/5 throughout.  Sensation to light touch intact.  Deep tendon reflexes 2+ throughout, toes downgoing.  Finger to nose testing intact.  Gait normal, Romberg negative.   Shon Millet, DO  CC: ***

## 2023-03-15 ENCOUNTER — Ambulatory Visit: Payer: BC Managed Care – PPO | Admitting: Neurology

## 2023-04-21 IMAGING — CT CT ANKLE*R* W/O CM
4 of 6 series · 13 of 33 positions shown, 15 images · non-contrast
Comparison: Radiographs 09/28/2021

CLINICAL DATA: Evaluate complex ankle fractures.

EXAM:
CT OF THE RIGHT ANKLE WITHOUT CONTRAST
TECHNIQUE: Multidetector CT imaging of the right ankle was performed according
to the standard protocol. Multiplanar CT image reconstructions were
also generated.
RADIATION DOSE REDUCTION: This exam was performed according to the
departmental dose-optimization program which includes automated
exposure control, adjustment of the mA and/or kV according to
patient size and/or use of iterative reconstruction technique.

[Series 4: axial bone · axial · 0.24mm/px · z∈[+181,+268]mm · 5 of 133 slices shown, 7 images]
[im 23/133  soft-tissue]
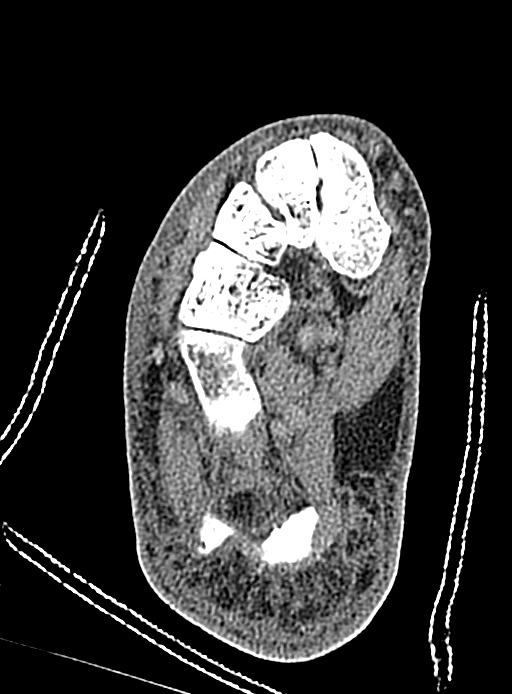
[im 23/133  bone]
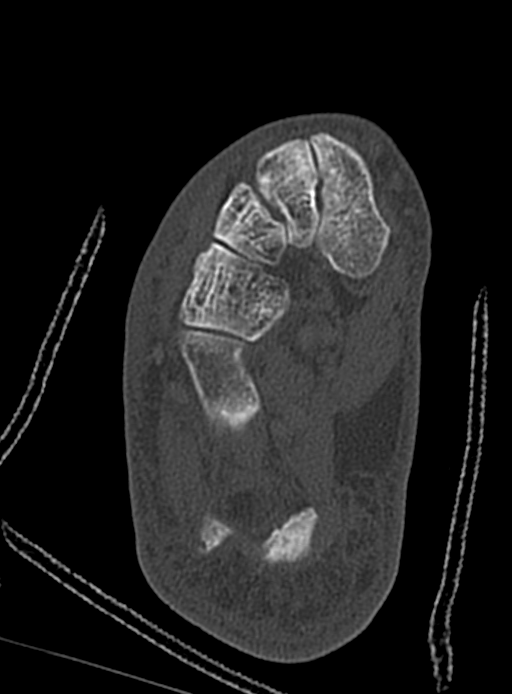
[im 45/133  bone]
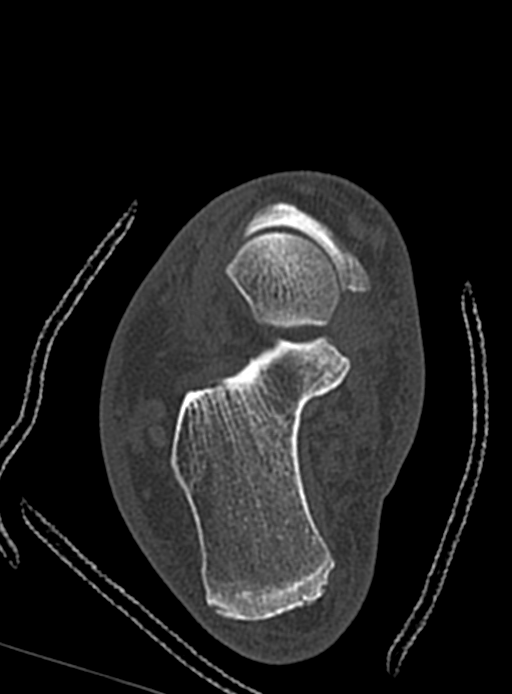
[im 67/133  bone]
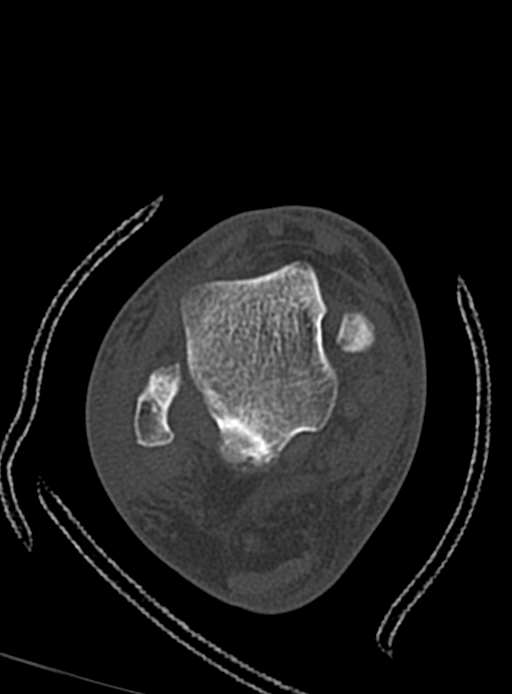
[im 89/133  bone]
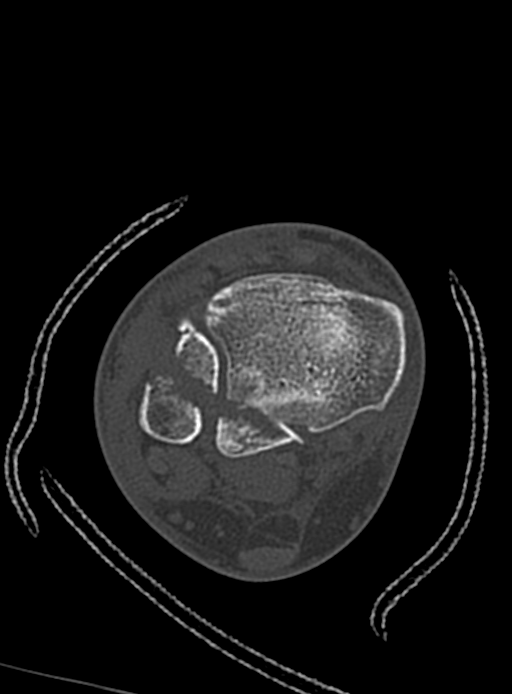
[im 111/133  soft-tissue]
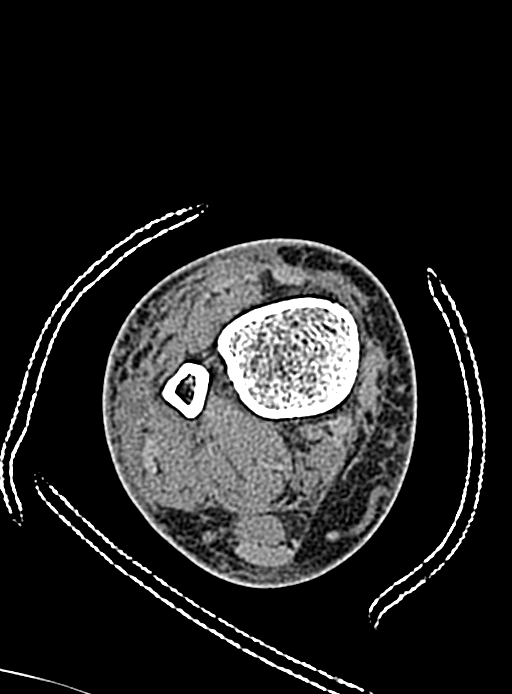
[im 111/133  bone]
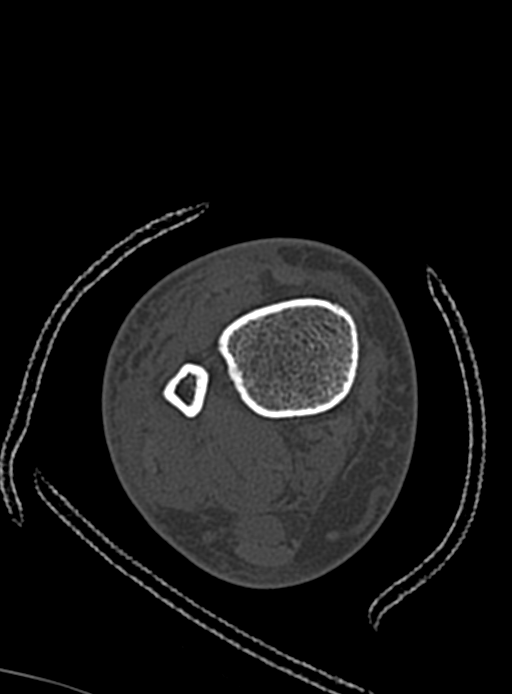

[Series 5: cor bone · coronal · 0.24mm/px · 1 of 165 slices shown]
[im 83/165  bone]
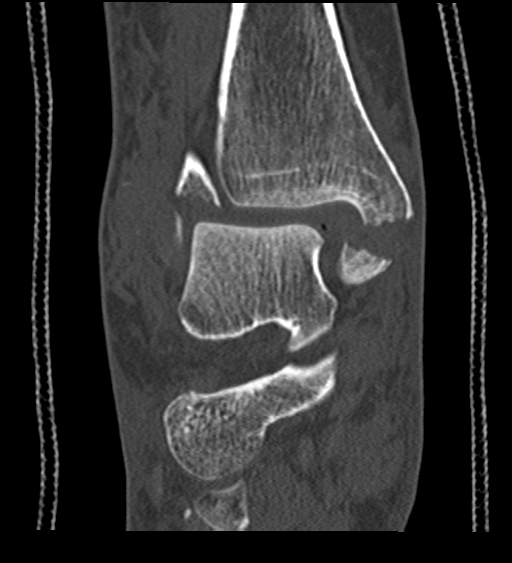

[Series 6: sag bone · sagittal · 0.26mm/px · 5 of 122 slices shown]
[im 21/122  bone]
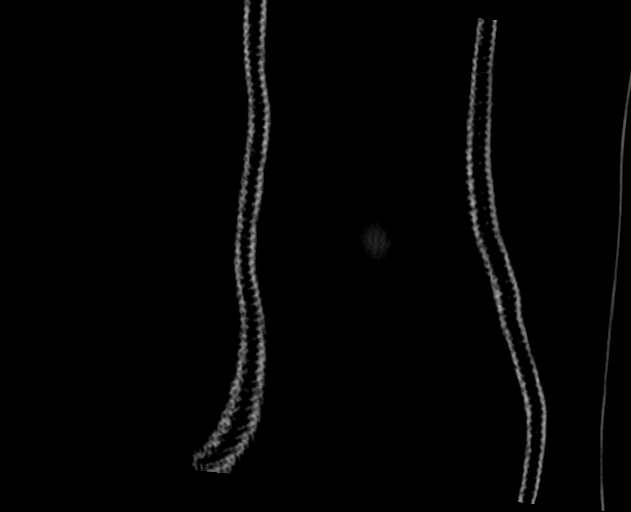
[im 41/122  bone]
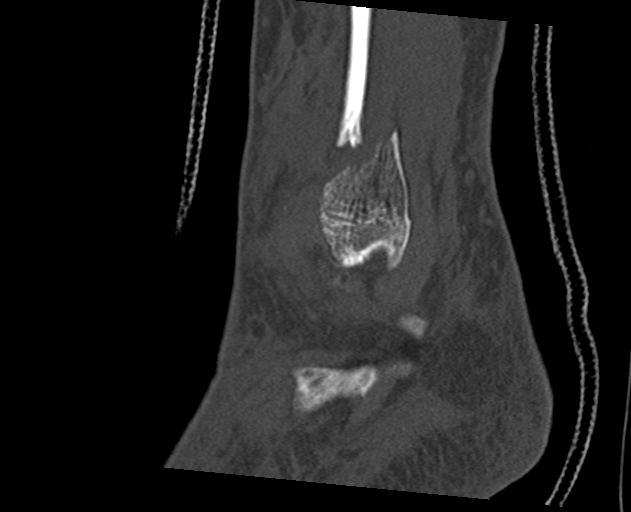
[im 61/122  bone]
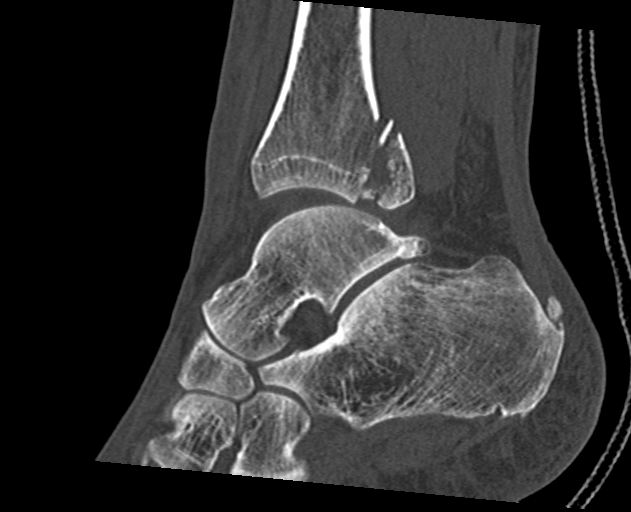
[im 81/122  bone]
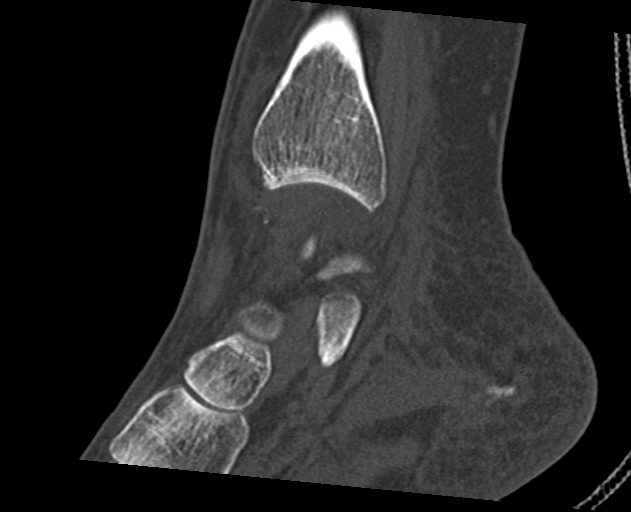
[im 101/122  bone]
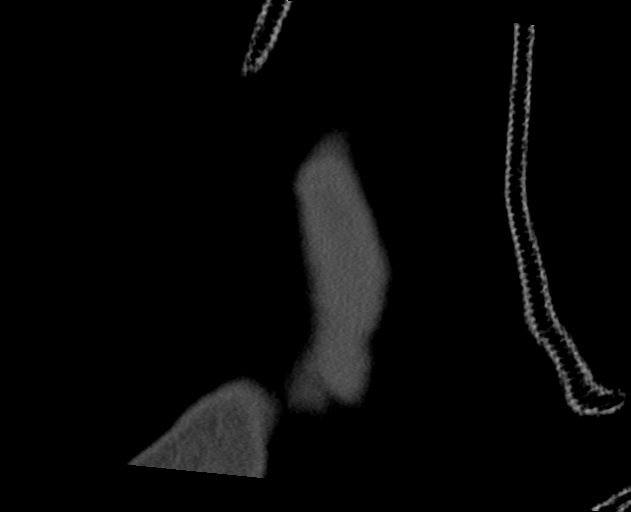

[Series 7: axial st · axial · 0.24mm/px · z∈[+181,+203]mm · 2 of 133 slices shown]
[im 23/133  bone]
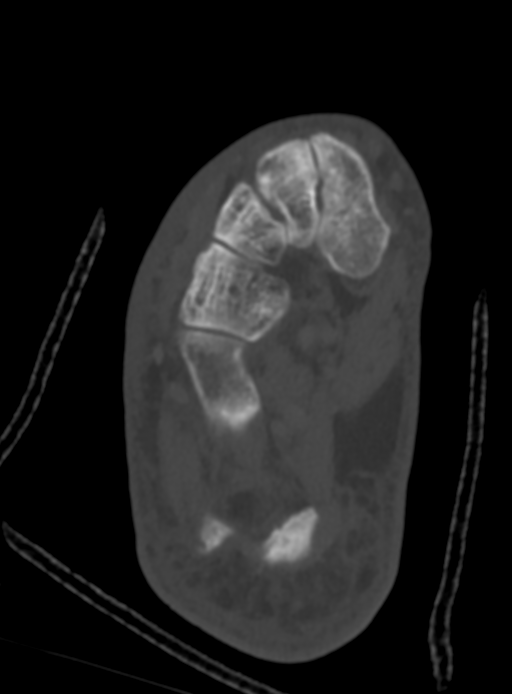
[im 45/133  bone]
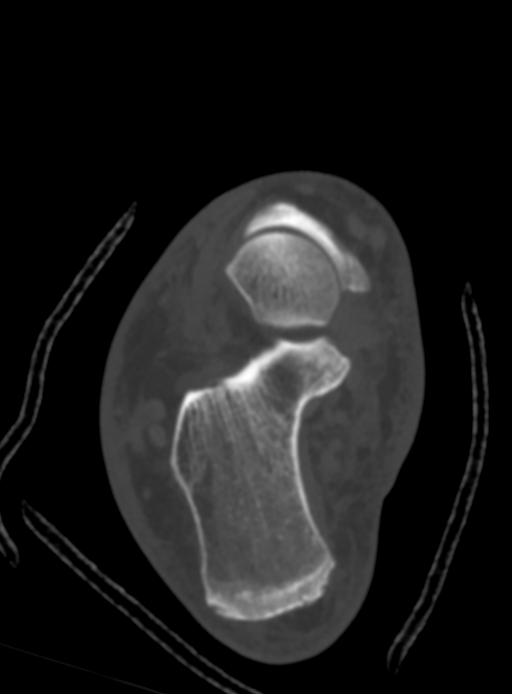

[13 of 33 positions shown; findings below may reference images not displayed]

FINDINGS: There is an oblique coursing mildly displaced fracture of the distal
fibular shaft at and above the level of the ankle mortise. Posterior
and lateral displacement a maximum of 5 mm.

Displaced transverse fracture through the base of the medial
malleolus with maximum displacement 9.5 mm. There is also a mildly
comminuted and mildly displaced intra-articular fracture of the
posterior malleolus of the tibia. Maximum displacement along the
articular surface is 5.5 mm. Moderate medial mortise widening is
noted.

The talus is intact. The subtalar joints are maintained. The mid and
hindfoot bony structures are intact.
IMPRESSION: 1. Trimalleolar fracture as described above.
2. Moderate medial mortise widening.

## 2023-09-06 NOTE — Progress Notes (Deleted)
 NEUROLOGY FOLLOW UP OFFICE NOTE  Janice Ruiz [id number]  Assessment/Plan:   Chronic migraine without aura, without status migrainosus, not intractable Excessive daytime sleepiness/snoring/wakes up with headache - reports that she sleeps throughout the night - would evaluate for OSA  Migraine prevention:  propranolol ER 60mg  daily. *** Migraine rescue:  sumatriptan 100mg .  Zofran 4mg  for nausea. *** Limit use of pain relievers to no more than 2 days out of week to prevent risk of rebound or medication-overuse headache. Keep headache diary Follow up ***    Subjective:  Janice Ruiz is a 53 year old female with history of kidney stones who follows up for migraines.  UPDATE: Started propranolol. Intensity:  *** Duration:  *** with sumatriptan Frequency:  *** Frequency of abortive medication: *** Current NSAIDS/analgesics: Relafen, oxycodone Current triptans:  sumatriptan 100mg  Current ergotamine:  none Current anti-emetic:  Zofran 4mg  Current muscle relaxants:  cyclobenzaprine 10mg  TID (only takes at bedtime due to drowsiness) Current Antihypertensive medications:  propranolol ER 60mg  daily Current Antidepressant medications:  sertraline 200mg  daily Current Anticonvulsant medications:  none Current anti-CGRP:  none Current Vitamins/Herbal/Supplements:  none Current Antihistamines/Decongestants:  hydroxyzine Other therapy:  none   Caffeine:  1 cup of coffee daily during winter.  Dr. Reino Kent daily Diet:  32 oz water daily (drinking more makes ankle swell).  Skips meals.   Exercise:  just restarted gym Depression:  stable; Anxiety:  usually okay Other pain:  chronic back pain s/p 2 back surgeries Sleep hygiene:  good however tired all day.  Snores and sometimes wakes self off.  Referred to sleep medicine for evaluation of OSA ***  HISTORY: Headaches since young adulthood.  Worse over past 4-5 years.  Intensity:  Moderate-severe.  Location:  varies.  Sometimes back of  head/neck, top of head, temples, periorbital/maxillary; Quality:  Pressure, throbbing.  Duration:  all day.  Wakes up with headache.  Frequency:  daily (4 days out of month is severe with associated symptoms).  In late 2023, she had a persistent headache for 45 days.  Aura:  absent; Prodrome:  absent.  Associated symptoms:  nausea, photophobia, phononphobia.  Denies vomiting, visual disturbance, unilateral numbness or weakness.  Triggers:  unknown.  Relieving factors:  none.  Takes BC powder and ibuprofen (takes daily)  MRI of brain without contrast on 06/10/2021 showed no intracranial mass or other significant abnormality.  Past NSAIDS/analgesics:  Excedrin, acetaminophen,  Ibuprofen, BC powder Past abortive triptans:  none Past abortive ergotamine:  none Past muscle relaxants:  none Past anti-emetic:  none Past antihypertensive medications:  furosemide Past antidepressant medications:  Cymbalta, Lexapro, Celexa, Wellbutrin, Prozac Past anticonvulsant medications:  none Past anti-CGRP:  Emgality Past vitamins/Herbal/Supplements:  none Past antihistamines/decongestants:  none Other past therapies:  none   Family history of headache:  no  PAST MEDICAL HISTORY: No past medical history on file.  MEDICATIONS: Current Outpatient Medications on File Prior to Visit  Medication Sig Dispense Refill   busPIRone (BUSPAR) 15 MG tablet Take 15 mg by mouth 2 (two) times daily as needed.     hydrOXYzine (ATARAX) 25 MG tablet Take 25 mg by mouth in the morning and at bedtime.     nabumetone (RELAFEN) 750 MG tablet Take 1,500 mg by mouth daily.     ondansetron (ZOFRAN) 4 MG tablet Take 1 tablet (4 mg total) by mouth every 8 (eight) hours as needed for nausea or vomiting. 20 tablet 5   pantoprazole (PROTONIX) 40 MG tablet Take 40 mg by mouth daily.  propranolol ER (INDERAL LA) 60 MG 24 hr capsule Take 1 capsule (60 mg total) by mouth daily. 30 capsule 5   sertraline (ZOLOFT) 100 MG tablet Take 200  mg by mouth daily.     SUMAtriptan (IMITREX) 100 MG tablet Take 1 tablet (100 mg total) by mouth as needed for migraine. May repeat in 2 hours if headache persists or recurs.  Maximum 2 tablets in 24 hours. 10 tablet 5   No current facility-administered medications on file prior to visit.    ALLERGIES: Allergies  Allergen Reactions   Benadryl [Diphenhydramine]    Lexapro [Escitalopram] Other (See Comments)    Excessive sleepiness, weight gain    FAMILY HISTORY: No family history on file.    Objective:  *** General: No acute distress.  Patient appears ***-groomed.   Head:  Normocephalic/atraumatic Eyes:  Fundi examined but not visualized Neck: supple, no paraspinal tenderness, full range of motion Heart:  Regular rate and rhythm Lungs:  Clear to auscultation bilaterally Back: No paraspinal tenderness Neurological Exam: alert and oriented.  Speech fluent and not dysarthric, language intact.  CN II-XII intact. Bulk and tone normal, muscle strength 5/5 throughout.  Sensation to light touch intact.  Deep tendon reflexes 2+ throughout, toes downgoing.  Finger to nose testing intact.  Gait normal, Romberg negative.   Shon Millet, DO  CC: ***

## 2023-09-07 ENCOUNTER — Ambulatory Visit: Payer: BC Managed Care – PPO | Admitting: Neurology

## 2023-09-07 NOTE — Progress Notes (Unsigned)
Virtual Visit via Video Note  Consent was obtained for video visit:  Yes.   Answered questions that patient had about telehealth interaction:  Yes.   I discussed the limitations, risks, security and privacy concerns of performing an evaluation and management service by telemedicine. I also discussed with the patient that there may be a patient responsible charge related to this service. The patient expressed understanding and agreed to proceed.  Pt location: Home Physician Location: office Name of referring provider:  Richardean Chimera, MD I connected with Carloyn Jaeger at patients initiation/request on 09/08/2023 at  2:50 PM EST by video enabled telemedicine application and verified that I am speaking with the correct person using two identifiers. Pt MRN:  [id number] Pt DOB:  04-15-1971 Video Participants:  Carloyn Jaeger;  Assessment/Plan:   Migraine without aura, without status migrainosus, not intractable  Migraine prevention:  Qulipta 60mg  daily (per PCP) Migraine rescue:  Tylenol or Advil Limit use of pain relievers to no more than 2 days out of week to prevent risk of rebound or medication-overuse headache. Keep headache diary She would like to maintain contact with me.  Follow up in one year    Subjective:  Janice Ruiz is a 53 year old female with history of kidney stones who follows up for migraines.  UPDATE: No longer on propranolol or sumatriptan. Ineffective.  Now on Qulipta and doing better.  She says she "very rarely" gets a headache.  Aborts quickly with ibuprofen or Tylenol  Current NSAIDS/analgesics: Relafen, oxycodone Current triptans:  none Current ergotamine:  none Current anti-emetic: none Current muscle relaxants:  cyclobenzaprine 10mg  TID (only takes at bedtime due to drowsiness) Current Antihypertensive medications: none Current Antidepressant medications:  sertraline 200mg  daily Current Anticonvulsant medications:  none Current anti-CGRP:  Qulipta  60mg  daily Current Vitamins/Herbal/Supplements:  none Current Antihistamines/Decongestants:  hydroxyzine Other therapy:  none   Caffeine:  1 cup of coffee daily during winter.  Dr. Reino Kent daily Diet:  32 oz water daily (drinking more makes ankle swell).  Skips meals.   Exercise:  just restarted gym Depression:  stable; Anxiety:  usually okay Other pain:  chronic back pain s/p 2 back surgeries Sleep hygiene:  Sleep study negative for OSA  HISTORY: Headaches since young adulthood.  Worse over past 4-5 years.  Intensity:  Moderate-severe.  Location:  varies.  Sometimes back of head/neck, top of head, temples, periorbital/maxillary; Quality:  Pressure, throbbing.  Duration:  all day.  Wakes up with headache.  Frequency:  daily (4 days out of month is severe with associated symptoms).  In late 2023, she had a persistent headache for 45 days.  Aura:  absent; Prodrome:  absent.  Associated symptoms:  nausea, photophobia, phononphobia.  Denies vomiting, visual disturbance, unilateral numbness or weakness.  Triggers:  unknown.  Relieving factors:  none.  Takes BC powder and ibuprofen (takes daily)  MRI of brain without contrast on 06/10/2021 showed no intracranial mass or other significant abnormality.  Past NSAIDS/analgesics:  Excedrin, acetaminophen,  Ibuprofen, BC powder Past abortive triptans:  none Past abortive ergotamine:  none Past muscle relaxants:  none Past anti-emetic:  none Past antihypertensive medications:  furosemide Past antidepressant medications:  Cymbalta, Lexapro, Celexa, Wellbutrin, Prozac Past anticonvulsant medications:  none Past anti-CGRP:  Emgality Past vitamins/Herbal/Supplements:  none Past antihistamines/decongestants:  none Other past therapies:  none   Family history of headache:  no  Past Medical History: No past medical history on file.  Medications: Outpatient Encounter Medications as of 09/08/2023  Medication Sig   busPIRone (BUSPAR) 15 MG tablet Take  15 mg by mouth 2 (two) times daily as needed.   hydrOXYzine (ATARAX) 25 MG tablet Take 25 mg by mouth in the morning and at bedtime.   nabumetone (RELAFEN) 750 MG tablet Take 1,500 mg by mouth daily.   ondansetron (ZOFRAN) 4 MG tablet Take 1 tablet (4 mg total) by mouth every 8 (eight) hours as needed for nausea or vomiting.   pantoprazole (PROTONIX) 40 MG tablet Take 40 mg by mouth daily.   propranolol ER (INDERAL LA) 60 MG 24 hr capsule Take 1 capsule (60 mg total) by mouth daily.   sertraline (ZOLOFT) 100 MG tablet Take 200 mg by mouth daily.   SUMAtriptan (IMITREX) 100 MG tablet Take 1 tablet (100 mg total) by mouth as needed for migraine. May repeat in 2 hours if headache persists or recurs.  Maximum 2 tablets in 24 hours.   No facility-administered encounter medications on file as of 09/08/2023.    Allergies: Allergies  Allergen Reactions   Benadryl [Diphenhydramine]    Lexapro [Escitalopram] Other (See Comments)    Excessive sleepiness, weight gain    Family History: No family history on file.  Observations/Objective:   No acute distress.  Alert and oriented.  Speech fluent and not dysarthric.  Language intact.    Follow Up Instructions:    -I discussed the assessment and treatment plan with the patient. The patient was provided an opportunity to ask questions and all were answered. The patient agreed with the plan and demonstrated an understanding of the instructions.   The patient was advised to call back or seek an in-person evaluation if the symptoms worsen or if the condition fails to improve as anticipated.   Cira Servant, DO

## 2023-09-08 ENCOUNTER — Encounter: Payer: Self-pay | Admitting: Neurology

## 2023-09-08 ENCOUNTER — Telehealth (INDEPENDENT_AMBULATORY_CARE_PROVIDER_SITE_OTHER): Payer: BC Managed Care – PPO | Admitting: Neurology

## 2023-09-08 DIAGNOSIS — G43009 Migraine without aura, not intractable, without status migrainosus: Secondary | ICD-10-CM | POA: Diagnosis not present

## 2023-12-03 ENCOUNTER — Other Ambulatory Visit: Payer: Self-pay | Admitting: Neurology

## 2024-09-08 ENCOUNTER — Ambulatory Visit: Payer: BC Managed Care – PPO | Admitting: Neurology
